# Patient Record
Sex: Female | Born: 1950 | Race: Black or African American | Hispanic: No | Marital: Single | Smoking: Never smoker
Health system: Southern US, Community
[De-identification: ages and names within clinical notes are randomized; demographics above are authoritative.]

## PROBLEM LIST (undated history)

## (undated) DIAGNOSIS — E119 Type 2 diabetes mellitus without complications: Secondary | ICD-10-CM

## (undated) HISTORY — PX: BREAST LUMPECTOMY: SHX2

## (undated) HISTORY — PX: BREAST EXCISIONAL BIOPSY: SUR124

## (undated) HISTORY — PX: CHOLECYSTECTOMY: SHX55

## (undated) HISTORY — PX: APPENDECTOMY: SHX54

## (undated) HISTORY — PX: BREAST BIOPSY: SHX20

## (undated) HISTORY — PX: ABDOMINAL HYSTERECTOMY: SHX81

---

## 2004-11-14 ENCOUNTER — Other Ambulatory Visit: Admission: RE | Admit: 2004-11-14 | Discharge: 2004-11-14 | Payer: Self-pay | Admitting: Internal Medicine

## 2004-12-12 ENCOUNTER — Encounter: Admission: RE | Admit: 2004-12-12 | Discharge: 2004-12-12 | Payer: Self-pay | Admitting: Internal Medicine

## 2005-03-01 ENCOUNTER — Ambulatory Visit (HOSPITAL_COMMUNITY): Admission: RE | Admit: 2005-03-01 | Discharge: 2005-03-01 | Payer: Self-pay | Admitting: Gastroenterology

## 2005-12-18 ENCOUNTER — Encounter: Admission: RE | Admit: 2005-12-18 | Discharge: 2005-12-18 | Payer: Self-pay | Admitting: Internal Medicine

## 2007-01-09 ENCOUNTER — Encounter: Admission: RE | Admit: 2007-01-09 | Discharge: 2007-01-09 | Payer: Self-pay | Admitting: Internal Medicine

## 2007-11-25 ENCOUNTER — Other Ambulatory Visit: Admission: RE | Admit: 2007-11-25 | Discharge: 2007-11-25 | Payer: Self-pay | Admitting: Internal Medicine

## 2008-01-14 ENCOUNTER — Encounter: Admission: RE | Admit: 2008-01-14 | Discharge: 2008-01-14 | Payer: Self-pay | Admitting: Internal Medicine

## 2009-01-14 ENCOUNTER — Encounter: Admission: RE | Admit: 2009-01-14 | Discharge: 2009-01-14 | Payer: Self-pay | Admitting: Internal Medicine

## 2010-01-16 ENCOUNTER — Encounter: Admission: RE | Admit: 2010-01-16 | Discharge: 2010-01-16 | Payer: Self-pay | Admitting: Internal Medicine

## 2011-01-17 ENCOUNTER — Encounter
Admission: RE | Admit: 2011-01-17 | Discharge: 2011-01-17 | Payer: Self-pay | Source: Home / Self Care | Attending: Internal Medicine | Admitting: Internal Medicine

## 2011-05-18 NOTE — Op Note (Signed)
NAME:  Paula Delacruz, Paula Delacruz NO.:  [id number]   MEDICAL RECORD NO.:  [id number]          PATIENT TYPE:  AMB   LOCATION:  ENDO                         FACILITY:  [facility]   PHYSICIAN:  Danise Edge, M.D.   DATE OF BIRTH:  06-Mar-1951   DATE OF PROCEDURE:  03/01/2005  DATE OF DISCHARGE:                                 OPERATIVE REPORT   PROCEDURE INDICATION:  Ms. Paula Delacruz is a 60 year old female born May 27, 1951. Ms. Paula Delacruz is scheduled to undergo her first screening colonoscopy with  polypectomy to prevent colon cancer.   ENDOSCOPIST:  Danise Edge, M.D.   PREMEDICATION:  Versed 5 mg, Demerol 50 mg.   PROCEDURE:  After obtaining informed consent, Ms. Paula Delacruz was placed in the  left lateral decubitus position. I administered intravenous Demerol and  intravenous Versed to achieve conscious sedation for the procedure. The  patient's blood pressure, oxygen saturation and cardiac rhythm were  monitored throughout the procedure and documented in the medical record.   Anal inspection and digital rectal exam were normal. The Olympus adjustable  pediatric colonoscope was introduced into the rectum and advanced to the  cecum. Colonic preparation for the exam today was excellent.   Rectum normal.  Sigmoid colon and descending colon normal.  Splenic flexure normal.  Transverse colon normal.  Hepatic flexure normal  Ascending colon normal.  Cecum and ileocecal valve normal.   ASSESSMENT:  Normal proctocolonoscopy to cecum. No endoscopic evidence for  the presence of colorectal neoplasia.      MJ/MEDQ  D:  03/01/2005  T:  03/01/2005  Job:  [id number]   cc:   Georgann Housekeeper, MD  [address]  [place]  [place] [address]  Fax: [phone]

## 2011-12-17 ENCOUNTER — Other Ambulatory Visit: Payer: Self-pay | Admitting: Internal Medicine

## 2011-12-17 DIAGNOSIS — Z1231 Encounter for screening mammogram for malignant neoplasm of breast: Secondary | ICD-10-CM

## 2012-01-21 ENCOUNTER — Ambulatory Visit
Admission: RE | Admit: 2012-01-21 | Discharge: 2012-01-21 | Disposition: A | Discharge status: Home / Self Care | Payer: BC Managed Care – PPO | Source: Ambulatory Visit | Attending: Internal Medicine | Admitting: Internal Medicine

## 2012-01-21 DIAGNOSIS — Z1231 Encounter for screening mammogram for malignant neoplasm of breast: Secondary | ICD-10-CM

## 2012-12-15 ENCOUNTER — Other Ambulatory Visit: Payer: Self-pay | Admitting: Internal Medicine

## 2012-12-15 DIAGNOSIS — Z1231 Encounter for screening mammogram for malignant neoplasm of breast: Secondary | ICD-10-CM

## 2013-01-23 ENCOUNTER — Ambulatory Visit
Admission: RE | Admit: 2013-01-23 | Discharge: 2013-01-23 | Disposition: A | Discharge status: Home / Self Care | Payer: BC Managed Care – PPO | Source: Ambulatory Visit | Attending: Internal Medicine | Admitting: Internal Medicine

## 2013-01-23 DIAGNOSIS — Z1231 Encounter for screening mammogram for malignant neoplasm of breast: Secondary | ICD-10-CM

## 2014-01-05 ENCOUNTER — Other Ambulatory Visit: Payer: Self-pay

## 2014-01-05 DIAGNOSIS — Z1231 Encounter for screening mammogram for malignant neoplasm of breast: Secondary | ICD-10-CM

## 2014-01-26 ENCOUNTER — Ambulatory Visit
Admission: RE | Admit: 2014-01-26 | Discharge: 2014-01-26 | Disposition: A | Discharge status: Home / Self Care | Payer: BC Managed Care – PPO | Source: Ambulatory Visit

## 2014-01-26 DIAGNOSIS — Z1231 Encounter for screening mammogram for malignant neoplasm of breast: Secondary | ICD-10-CM

## 2015-01-07 ENCOUNTER — Other Ambulatory Visit: Payer: Self-pay

## 2015-01-07 DIAGNOSIS — Z1231 Encounter for screening mammogram for malignant neoplasm of breast: Secondary | ICD-10-CM

## 2015-01-27 ENCOUNTER — Ambulatory Visit
Admission: RE | Admit: 2015-01-27 | Discharge: 2015-01-27 | Disposition: A | Discharge status: Home / Self Care | Payer: BC Managed Care – PPO | Source: Ambulatory Visit

## 2015-01-27 DIAGNOSIS — Z1231 Encounter for screening mammogram for malignant neoplasm of breast: Secondary | ICD-10-CM

## 2015-03-30 ENCOUNTER — Other Ambulatory Visit: Payer: Self-pay | Admitting: Gastroenterology

## 2015-06-21 ENCOUNTER — Other Ambulatory Visit: Payer: Self-pay | Admitting: Gastroenterology

## 2015-06-21 ENCOUNTER — Encounter (HOSPITAL_COMMUNITY): Payer: Self-pay | Admitting: *Deleted

## 2015-06-28 ENCOUNTER — Ambulatory Visit (HOSPITAL_COMMUNITY)
Admission: RE | Admit: 2015-06-28 | Discharge: 2015-06-28 | Disposition: A | Discharge status: Home / Self Care | Payer: BC Managed Care – PPO | Source: Ambulatory Visit | Attending: Gastroenterology | Admitting: Gastroenterology

## 2015-06-28 ENCOUNTER — Encounter (HOSPITAL_COMMUNITY): Payer: Self-pay | Admitting: Registered Nurse

## 2015-06-28 ENCOUNTER — Encounter (HOSPITAL_COMMUNITY)
Admission: RE | Disposition: A | Discharge status: Home / Self Care | Payer: Self-pay | Source: Ambulatory Visit | Attending: Gastroenterology

## 2015-06-28 ENCOUNTER — Ambulatory Visit (HOSPITAL_COMMUNITY): Payer: BC Managed Care – PPO | Admitting: Anesthesiology

## 2015-06-28 DIAGNOSIS — Z1211 Encounter for screening for malignant neoplasm of colon: Secondary | ICD-10-CM | POA: Insufficient documentation

## 2015-06-28 DIAGNOSIS — M1990 Unspecified osteoarthritis, unspecified site: Secondary | ICD-10-CM | POA: Diagnosis not present

## 2015-06-28 DIAGNOSIS — Z9049 Acquired absence of other specified parts of digestive tract: Secondary | ICD-10-CM | POA: Diagnosis not present

## 2015-06-28 DIAGNOSIS — E119 Type 2 diabetes mellitus without complications: Secondary | ICD-10-CM | POA: Diagnosis not present

## 2015-06-28 HISTORY — DX: Type 2 diabetes mellitus without complications: E11.9

## 2015-06-28 HISTORY — PX: COLONOSCOPY WITH PROPOFOL: SHX5780

## 2015-06-28 LAB — GLUCOSE, CAPILLARY: Glucose-Capillary: 111 mg/dL — ABNORMAL HIGH (ref 65–99)

## 2015-06-28 SURGERY — COLONOSCOPY WITH PROPOFOL
Anesthesia: Monitor Anesthesia Care

## 2015-06-28 MED ORDER — SODIUM CHLORIDE 0.9 % IV SOLN
INTRAVENOUS | Status: DC
Start: 2015-06-28 — End: 2015-06-28

## 2015-06-28 MED ORDER — LACTATED RINGERS IV SOLN
INTRAVENOUS | Status: DC
  Administered 2015-06-28: 11:00:00 via INTRAVENOUS
  Administered 2015-06-28: 1000 mL via INTRAVENOUS

## 2015-06-28 MED ORDER — PROPOFOL INFUSION 10 MG/ML OPTIME
INTRAVENOUS | Status: DC | PRN
  Administered 2015-06-28: 120 ug/kg/min via INTRAVENOUS

## 2015-06-28 MED ORDER — PROPOFOL 10 MG/ML IV BOLUS
INTRAVENOUS | Status: AC
  Filled 2015-06-28: qty 20

## 2015-06-28 MED ORDER — LIDOCAINE HCL (CARDIAC) 20 MG/ML IV SOLN
INTRAVENOUS | Status: AC
  Filled 2015-06-28: qty 5

## 2015-06-28 MED ORDER — LIDOCAINE HCL (CARDIAC) 20 MG/ML IV SOLN
INTRAVENOUS | Status: DC | PRN
  Administered 2015-06-28: 100 mg via INTRAVENOUS

## 2015-06-28 MED ORDER — SODIUM CHLORIDE 0.9 % IV SOLN: INTRAVENOUS | Status: DC

## 2015-06-28 SURGICAL SUPPLY — 21 items

## 2015-06-28 NOTE — H&P (Signed)
  Procedure: Screening colonoscopy. Normal screening colonoscopy performed on 03/01/2005  History: The patient is a 64 year old female born 11-Aug-1951. She is scheduled to undergo a repeat screening colonoscopy today.  Medication allergies: None  Past medical history: Appendectomy. Cholecystectomy. Total abdominal hysterectomy. Bilateral salpingo-oophorectomy. Type 2 diabetes mellitus. Hypercholesterolemia. Osteoarthritis.  Exam: The patient is alert and lying comfortably on the endoscopy stretcher. Abdomen is soft and nontender to palpation. Cardiac exam reveals a regular rhythm. Lungs are clear to auscultation.  Plan: Proceed with screening colonoscopy

## 2015-06-28 NOTE — Discharge Instructions (Signed)

## 2015-06-28 NOTE — Transfer of Care (Signed)
Immediate Anesthesia Transfer of Care Note  Patient: Paula Delacruz  Procedure(s) Performed: Procedure(s): COLONOSCOPY WITH PROPOFOL (N/A)  Patient Location: PACU  Anesthesia Type:MAC  Level of Consciousness: awake, alert  and oriented  Airway & Oxygen Therapy: Patient Spontanous Breathing and Patient connected to face mask oxygen  Post-op Assessment: Report given to RN  Post vital signs: Reviewed and stable  Last Vitals:  Filed Vitals:   06/28/15 0831  BP: 135/75  Temp: 36.6 C  Resp: 19    Complications: No apparent anesthesia complications

## 2015-06-28 NOTE — Anesthesia Preprocedure Evaluation (Addendum)
Anesthesia Evaluation  Patient identified by MRN, date of birth, ID band Patient awake    Reviewed: Allergy & Precautions, H&P , NPO status , Patient's Chart, lab work & pertinent test results  Airway Mallampati: II  TM Distance: >3 FB Neck ROM: full    Dental  (+) Dental Advisory Given, Missing All front upper teeth are missing:   Pulmonary neg pulmonary ROS,  breath sounds clear to auscultation  Pulmonary exam normal       Cardiovascular Exercise Tolerance: Good negative cardio ROS Normal cardiovascular examRhythm:regular Rate:Normal     Neuro/Psych negative neurological ROS  negative psych ROS   GI/Hepatic negative GI ROS, Neg liver ROS,   Endo/Other  negative endocrine ROSdiabetes, Well Controlled, Type 2, Oral Hypoglycemic Agents  Renal/GU negative Renal ROS  negative genitourinary   Musculoskeletal   Abdominal   Peds  Hematology negative hematology ROS (+)   Anesthesia Other Findings   Reproductive/Obstetrics negative OB ROS                          Anesthesia Physical Anesthesia Plan  ASA: II  Anesthesia Plan: MAC   Post-op Pain Management:    Induction:   Airway Management Planned: Simple Face Mask  Additional Equipment:   Intra-op Plan:   Post-operative Plan:   Informed Consent: I have reviewed the patients History and Physical, chart, labs and discussed the procedure including the risks, benefits and alternatives for the proposed anesthesia with the patient or authorized representative who has indicated his/her understanding and acceptance.   Dental Advisory Given  Plan Discussed with: CRNA and Surgeon  Anesthesia Plan Comments:        Anesthesia Quick Evaluation

## 2015-06-28 NOTE — Anesthesia Postprocedure Evaluation (Signed)
  Anesthesia Post-op Note  Patient: Paula Delacruz  Procedure(s) Performed: Procedure(s) (LRB): COLONOSCOPY WITH PROPOFOL (N/A)  Patient Location: PACU  Anesthesia Type: MAC  Level of Consciousness: awake and alert   Airway and Oxygen Therapy: Patient Spontanous Breathing  Post-op Pain: mild  Post-op Assessment: Post-op Vital signs reviewed, Patient's Cardiovascular Status Stable, Respiratory Function Stable, Patent Airway and No signs of Nausea or vomiting  Last Vitals:  Filed Vitals:   06/28/15 1115  BP:   Pulse: 46  Temp:   Resp: 20    Post-op Vital Signs: stable   Complications: No apparent anesthesia complications

## 2015-06-28 NOTE — Op Note (Signed)
Procedure: Screening colonoscopy. Normal screening colonoscopy performed on 03/01/2005  Endoscopist: Danise EdgeMartin Loletta Harper  Premedication: Propofol administered by anesthesia  Procedure: The patient was placed in the left lateral decubitus position. Anal inspection and digital rectal exam were normal. The Pentax pediatric colonoscope was introduced into the rectum and advanced to the cecum. A normal-appearing ileocecal valve and appendiceal orifice were identified. Colonic preparation for the exam today was good. Withdrawal time was 6 minutes  Rectum. Normal. Retroflexed view of the distal rectum was normal  Sigmoid colon and descending colon. Normal  Splenic flexure. Normal  Transverse colon. Normal  Hepatic flexure. Normal  Ascending colon. Normal  Cecum and ileocecal valve. Normal  Assessment: Normal screening colonoscopy  Recommendation: Schedule repeat screening colonoscopy in 10 years

## 2015-06-28 NOTE — Anesthesia Procedure Notes (Signed)
Procedure Name: MAC Date/Time: 06/28/2015 10:17 AM Performed by: Jarvis NewcomerARMISTEAD, Arrin Pintor A Pre-anesthesia Checklist: Patient identified, Timeout performed, Emergency Drugs available, Suction available and Patient being monitored Patient Re-evaluated:Patient Re-evaluated prior to inductionOxygen Delivery Method: Simple face mask Dental Injury: Teeth and Oropharynx as per pre-operative assessment

## 2015-06-29 ENCOUNTER — Encounter (HOSPITAL_COMMUNITY): Payer: Self-pay | Admitting: Gastroenterology

## 2016-01-06 ENCOUNTER — Other Ambulatory Visit: Payer: Self-pay

## 2016-01-06 DIAGNOSIS — Z1231 Encounter for screening mammogram for malignant neoplasm of breast: Secondary | ICD-10-CM

## 2016-01-31 ENCOUNTER — Ambulatory Visit
Admission: RE | Admit: 2016-01-31 | Discharge: 2016-01-31 | Disposition: A | Discharge status: Home / Self Care | Payer: BC Managed Care – PPO | Source: Ambulatory Visit

## 2016-01-31 DIAGNOSIS — Z1231 Encounter for screening mammogram for malignant neoplasm of breast: Secondary | ICD-10-CM

## 2017-01-04 ENCOUNTER — Other Ambulatory Visit: Payer: Self-pay | Admitting: Internal Medicine

## 2017-01-04 DIAGNOSIS — Z1231 Encounter for screening mammogram for malignant neoplasm of breast: Secondary | ICD-10-CM

## 2017-02-01 ENCOUNTER — Ambulatory Visit
Admission: RE | Admit: 2017-02-01 | Discharge: 2017-02-01 | Disposition: A | Discharge status: Home / Self Care | Payer: BC Managed Care – PPO | Source: Ambulatory Visit | Attending: Internal Medicine | Admitting: Internal Medicine

## 2017-02-01 DIAGNOSIS — Z1231 Encounter for screening mammogram for malignant neoplasm of breast: Secondary | ICD-10-CM

## 2018-01-17 ENCOUNTER — Other Ambulatory Visit: Payer: Self-pay | Admitting: Internal Medicine

## 2018-01-17 DIAGNOSIS — Z139 Encounter for screening, unspecified: Secondary | ICD-10-CM

## 2018-02-11 ENCOUNTER — Ambulatory Visit
Admission: RE | Admit: 2018-02-11 | Discharge: 2018-02-11 | Disposition: A | Discharge status: Home / Self Care | Payer: BC Managed Care – PPO | Source: Ambulatory Visit | Attending: Internal Medicine | Admitting: Internal Medicine

## 2018-02-11 DIAGNOSIS — Z139 Encounter for screening, unspecified: Secondary | ICD-10-CM

## 2019-01-27 ENCOUNTER — Other Ambulatory Visit: Payer: Self-pay | Admitting: Internal Medicine

## 2019-01-27 DIAGNOSIS — Z1231 Encounter for screening mammogram for malignant neoplasm of breast: Secondary | ICD-10-CM

## 2019-02-26 ENCOUNTER — Ambulatory Visit
Admission: RE | Admit: 2019-02-26 | Discharge: 2019-02-26 | Disposition: A | Discharge status: Home / Self Care | Payer: BC Managed Care – PPO | Source: Ambulatory Visit | Attending: Internal Medicine | Admitting: Internal Medicine

## 2019-02-26 DIAGNOSIS — Z1231 Encounter for screening mammogram for malignant neoplasm of breast: Secondary | ICD-10-CM

## 2020-02-03 ENCOUNTER — Other Ambulatory Visit: Payer: Self-pay | Admitting: Internal Medicine

## 2020-02-03 DIAGNOSIS — Z1231 Encounter for screening mammogram for malignant neoplasm of breast: Secondary | ICD-10-CM

## 2020-02-11 ENCOUNTER — Ambulatory Visit: Payer: BC Managed Care – PPO | Attending: Family

## 2020-02-11 DIAGNOSIS — Z23 Encounter for immunization: Secondary | ICD-10-CM | POA: Insufficient documentation

## 2020-02-11 NOTE — Progress Notes (Signed)
   Covid-19 Vaccination Clinic  Name:  Mckinzi Eriksen    MRN: [id number] DOB: February 03, 1951  02/11/2020  Ms. Hagarty was observed post Covid-19 immunization for 15 minutes without incidence. She was provided with Vaccine Information Sheet and instruction to access the V-Safe system.   Ms. Knapik was instructed to call 911 with any severe reactions post vaccine: Marland Kitchen Difficulty breathing  . Swelling of your face and throat  . A fast heartbeat  . A bad rash all over your body  . Dizziness and weakness

## 2020-03-08 ENCOUNTER — Ambulatory Visit
Admission: RE | Admit: 2020-03-08 | Discharge: 2020-03-08 | Disposition: A | Discharge status: Home / Self Care | Payer: BC Managed Care – PPO | Source: Ambulatory Visit | Attending: Internal Medicine | Admitting: Internal Medicine

## 2020-03-08 ENCOUNTER — Other Ambulatory Visit: Payer: Self-pay

## 2020-03-08 DIAGNOSIS — Z1231 Encounter for screening mammogram for malignant neoplasm of breast: Secondary | ICD-10-CM

## 2020-03-15 ENCOUNTER — Ambulatory Visit: Payer: BC Managed Care – PPO | Attending: Family

## 2020-03-15 DIAGNOSIS — Z23 Encounter for immunization: Secondary | ICD-10-CM

## 2020-03-15 NOTE — Progress Notes (Signed)
   Covid-19 Vaccination Clinic  Name:  Paula Delacruz    MRN: [id number] DOB: [birth year]  03/15/2020  Paula Delacruz was observed post Covid-19 immunization for 15 minutes without incident. She was provided with Vaccine Information Sheet and instruction to access the V-Safe system.   Paula Delacruz was instructed to call 911 with any severe reactions post vaccine: Marland Kitchen Difficulty breathing  . Swelling of face and throat  . A fast heartbeat  . A bad rash all over body  . Dizziness and weakness   Immunizations Administered    Name Date Dose VIS Date Route   Moderna COVID-19 Vaccine 03/15/2020 11:49 AM 0.5 mL 12/01/2019 Intramuscular   Manufacturer: Moderna   Lot: [id number]   NDC: 81840-375-43

## 2020-10-31 ENCOUNTER — Ambulatory Visit: Payer: BC Managed Care – PPO | Attending: Family

## 2020-10-31 DIAGNOSIS — Z23 Encounter for immunization: Secondary | ICD-10-CM

## 2020-12-28 NOTE — Progress Notes (Signed)
   Covid-19 Vaccination Clinic  Name:  Bless Lisenby    MRN: [id number] DOB: 1951/10/20  12/28/2020  Ms. Nuttle was observed post Covid-19 immunization for 15 minutes without incident. She was provided with Vaccine Information Sheet and instruction to access the V-Safe system.   Ms. Spinney was instructed to call 911 with any severe reactions post vaccine: Marland Kitchen Difficulty breathing  . Swelling of face and throat  . A fast heartbeat  . A bad rash all over body  . Dizziness and weakness   Immunizations Administered    Name Date Dose VIS Date Route   Moderna Covid-19 Booster Vaccine 10/31/2020  4:30 PM 0.25 mL 10/19/2020 Intramuscular   Manufacturer: Moderna   Lot: [id number]   NDC: 24580-998-33

## 2021-02-06 ENCOUNTER — Other Ambulatory Visit: Payer: Self-pay | Admitting: Internal Medicine

## 2021-02-06 DIAGNOSIS — Z1231 Encounter for screening mammogram for malignant neoplasm of breast: Secondary | ICD-10-CM

## 2021-03-22 ENCOUNTER — Other Ambulatory Visit: Payer: Self-pay

## 2021-03-22 ENCOUNTER — Ambulatory Visit
Admission: RE | Admit: 2021-03-22 | Discharge: 2021-03-22 | Disposition: A | Discharge status: Home / Self Care | Payer: BC Managed Care – PPO | Source: Ambulatory Visit | Attending: Internal Medicine | Admitting: Internal Medicine

## 2021-03-22 DIAGNOSIS — Z1231 Encounter for screening mammogram for malignant neoplasm of breast: Secondary | ICD-10-CM

## 2021-03-27 ENCOUNTER — Ambulatory Visit: Payer: BC Managed Care – PPO

## 2021-10-24 ENCOUNTER — Ambulatory Visit: Payer: BC Managed Care – PPO | Attending: Family

## 2021-10-24 DIAGNOSIS — Z23 Encounter for immunization: Secondary | ICD-10-CM

## 2021-10-24 NOTE — Progress Notes (Signed)
   Covid-19 Vaccination Clinic  Name:  [name]    MRN: [id number] DOB: 05/28/1951  10/24/2021  [name] was observed post Covid-19 immunization for 15 minutes without incident. She was provided with Vaccine Information Sheet and instruction to access the V-Safe system.   [name] was instructed to call 911 with any severe reactions post vaccine: Difficulty breathing  Swelling of face and throat  A fast heartbeat  A bad rash all over body  Dizziness and weakness   Immunizations Administered     Name Date Dose VIS Date Route   Moderna Covid-19 vaccine Bivalent Booster 10/24/2021  2:30 PM 0.5 mL 08/12/2021 Intramuscular   Manufacturer: Moderna   Lot: [id number]   NDC: 42353-614-43

## 2022-01-17 ENCOUNTER — Ambulatory Visit
Admission: RE | Admit: 2022-01-17 | Discharge: 2022-01-17 | Disposition: A | Discharge status: Home / Self Care | Payer: BC Managed Care – PPO | Source: Ambulatory Visit | Attending: Internal Medicine | Admitting: Internal Medicine

## 2022-01-17 ENCOUNTER — Other Ambulatory Visit: Payer: Self-pay | Admitting: Internal Medicine

## 2022-01-17 DIAGNOSIS — M542 Cervicalgia: Secondary | ICD-10-CM

## 2022-02-13 ENCOUNTER — Other Ambulatory Visit: Payer: Self-pay | Admitting: Internal Medicine

## 2022-02-13 DIAGNOSIS — Z1231 Encounter for screening mammogram for malignant neoplasm of breast: Secondary | ICD-10-CM

## 2022-03-23 ENCOUNTER — Ambulatory Visit
Admission: RE | Admit: 2022-03-23 | Discharge: 2022-03-23 | Disposition: A | Discharge status: Home / Self Care | Payer: BC Managed Care – PPO | Source: Ambulatory Visit | Attending: Internal Medicine | Admitting: Internal Medicine

## 2022-03-23 DIAGNOSIS — Z1231 Encounter for screening mammogram for malignant neoplasm of breast: Secondary | ICD-10-CM

## 2023-01-30 IMAGING — MG MM DIGITAL SCREENING BILAT W/ TOMO AND CAD
6 of 10 series · 6 of 30 positions shown · non-contrast
Comparison: Previous exam(s).

CLINICAL DATA: Screening.

EXAM:
DIGITAL SCREENING BILATERAL MAMMOGRAM WITH TOMOSYNTHESIS AND CAD
TECHNIQUE: Bilateral screening digital craniocaudal and mediolateral oblique
mammograms were obtained. Bilateral screening digital breast
tomosynthesis was performed. The images were evaluated with
computer-aided detection.

[R CC synth-2D (1 of 2)]
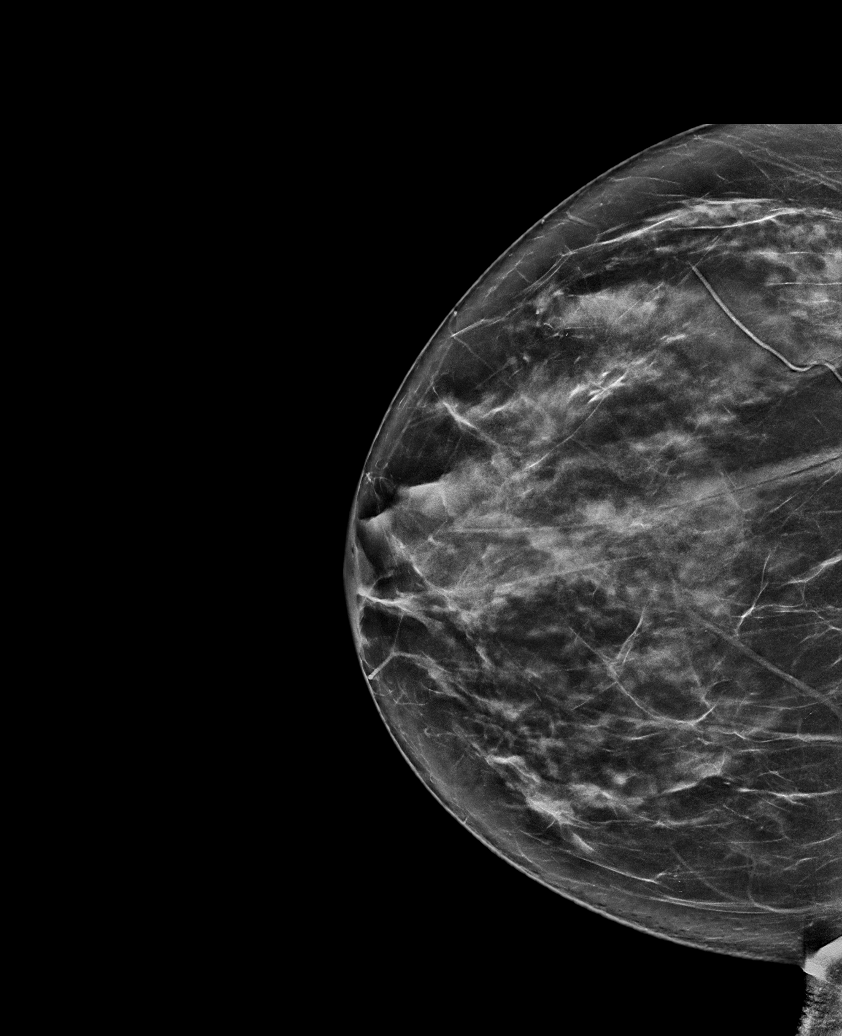

[L MLO synth-2D]
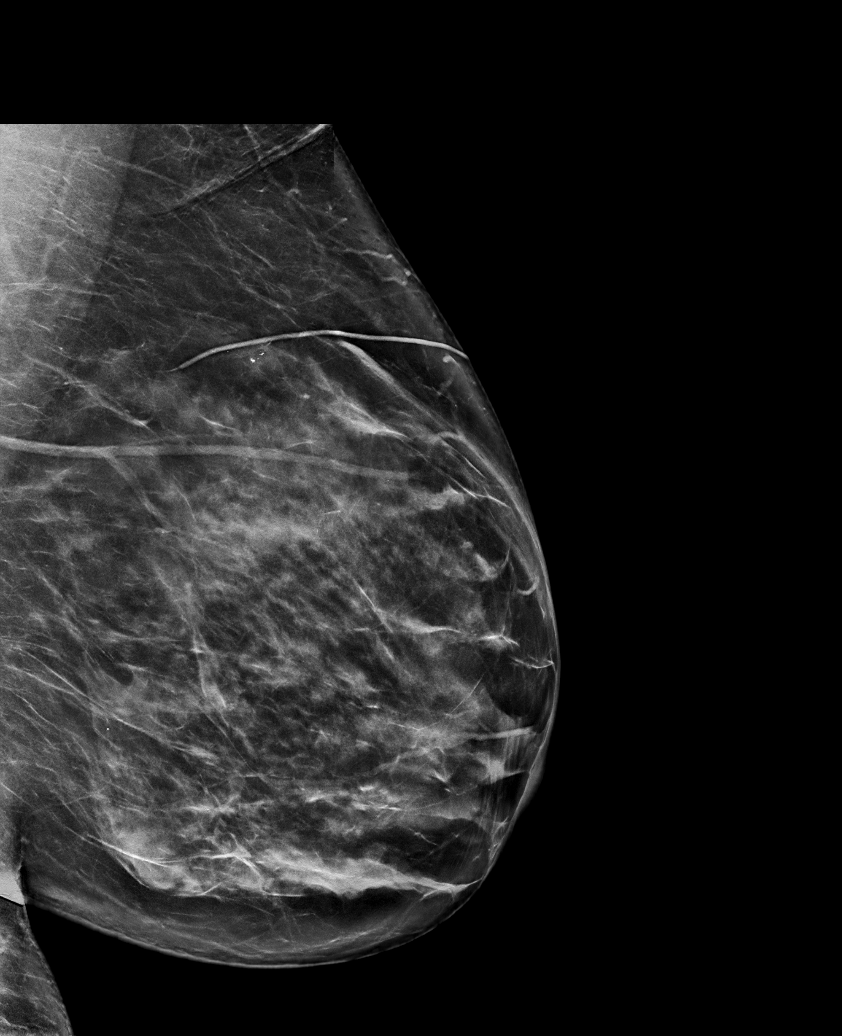

[L CC synth-2D]
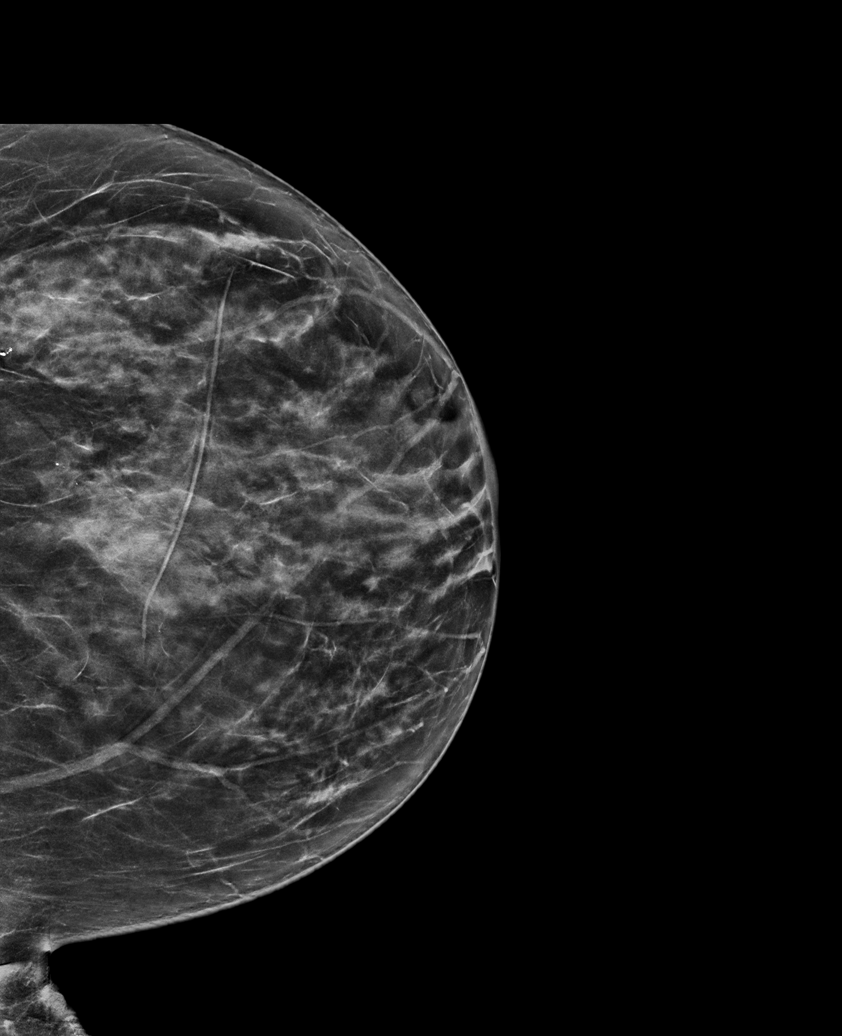

[R CC synth-2D (2 of 2)]
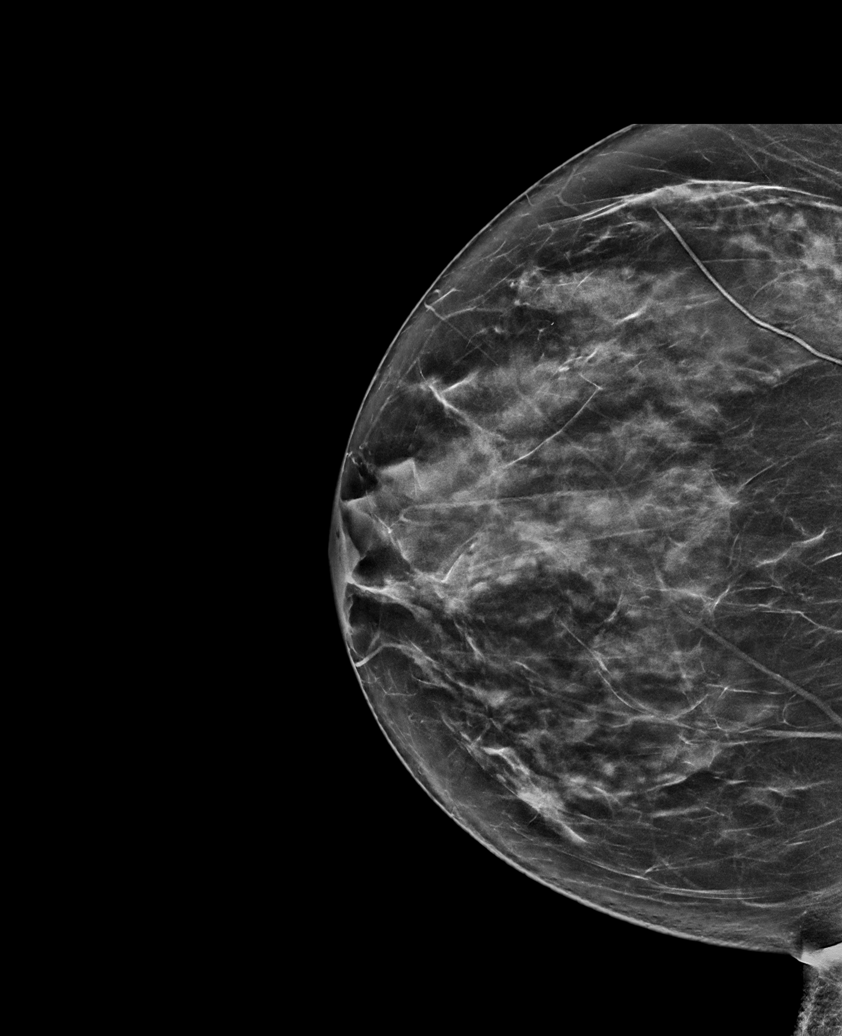

[R MLO synth-2D]
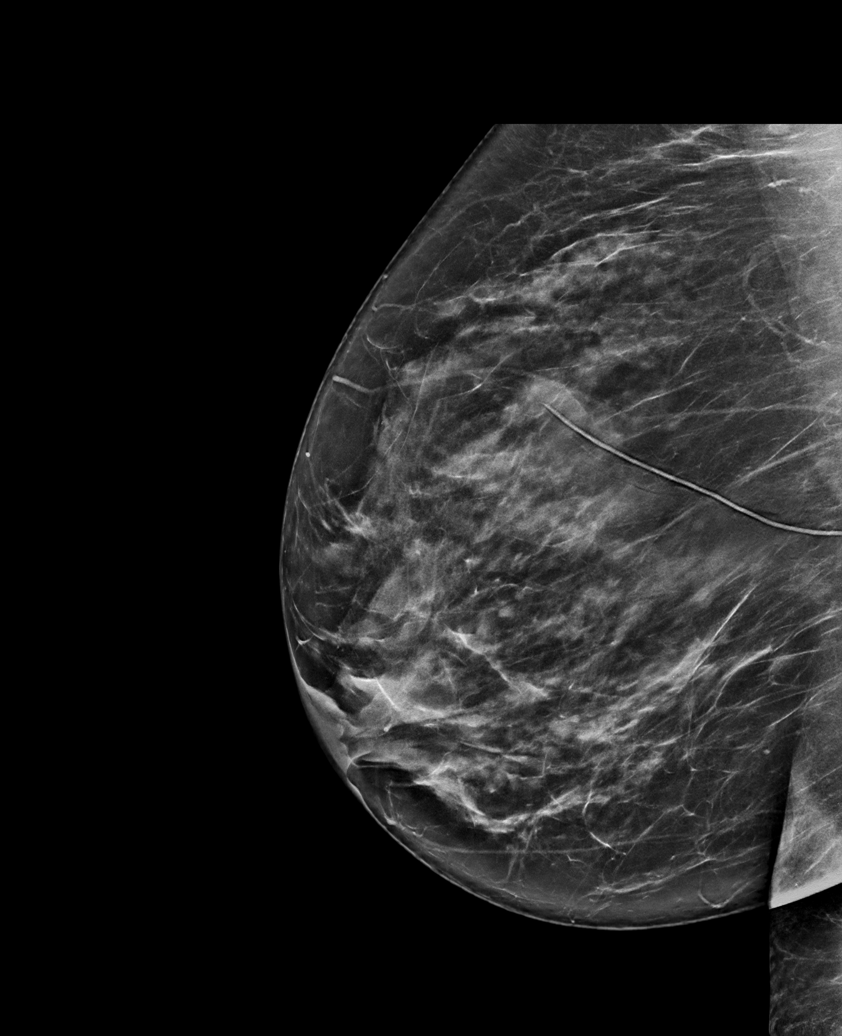

[L MLO tomo · tomo slice 41/81.0]
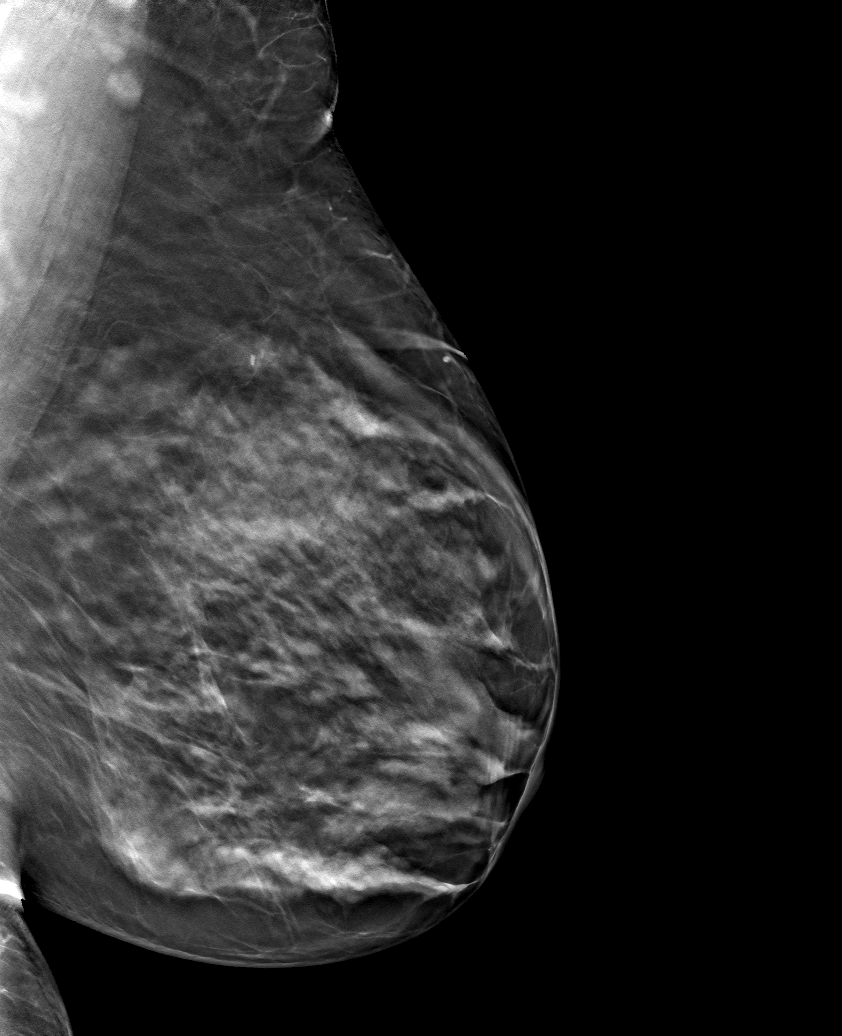

[6 of 30 positions shown; findings below may reference images not displayed]

ACR Breast Density Category c: The breast tissue is heterogeneously
dense, which may obscure small masses.
FINDINGS: There are no findings suspicious for malignancy.
IMPRESSION: No mammographic evidence of malignancy. A result letter of this
screening mammogram will be mailed directly to the patient.

RECOMMENDATION:
Screening mammogram in one year. (Code:Q3-W-BC3)

BI-RADS CATEGORY  1: Negative.

## 2023-02-12 ENCOUNTER — Other Ambulatory Visit: Payer: Self-pay | Admitting: Internal Medicine

## 2023-02-12 DIAGNOSIS — Z1231 Encounter for screening mammogram for malignant neoplasm of breast: Secondary | ICD-10-CM

## 2023-04-01 ENCOUNTER — Ambulatory Visit
Admission: RE | Admit: 2023-04-01 | Discharge: 2023-04-01 | Disposition: A | Discharge status: Home / Self Care | Payer: Medicare Other | Source: Ambulatory Visit | Attending: Internal Medicine | Admitting: Internal Medicine

## 2023-04-01 DIAGNOSIS — Z1231 Encounter for screening mammogram for malignant neoplasm of breast: Secondary | ICD-10-CM

## 2023-05-15 ENCOUNTER — Other Ambulatory Visit: Payer: Self-pay | Admitting: Internal Medicine

## 2023-05-15 DIAGNOSIS — E2839 Other primary ovarian failure: Secondary | ICD-10-CM

## 2023-05-28 ENCOUNTER — Ambulatory Visit
Admission: RE | Admit: 2023-05-28 | Discharge: 2023-05-28 | Disposition: A | Discharge status: Home / Self Care | Payer: Medicare Other | Source: Ambulatory Visit | Attending: Internal Medicine | Admitting: Internal Medicine

## 2023-05-28 DIAGNOSIS — E2839 Other primary ovarian failure: Secondary | ICD-10-CM

## 2024-02-24 ENCOUNTER — Other Ambulatory Visit: Payer: Self-pay | Admitting: Internal Medicine

## 2024-02-24 DIAGNOSIS — Z1231 Encounter for screening mammogram for malignant neoplasm of breast: Secondary | ICD-10-CM

## 2024-04-02 ENCOUNTER — Ambulatory Visit
Admission: RE | Admit: 2024-04-02 | Discharge: 2024-04-02 | Disposition: A | Discharge status: Home / Self Care | Payer: Medicare Other | Source: Ambulatory Visit | Attending: Internal Medicine | Admitting: Internal Medicine

## 2024-04-02 DIAGNOSIS — Z1231 Encounter for screening mammogram for malignant neoplasm of breast: Secondary | ICD-10-CM
